# Patient Record
Sex: Male | Born: 1985 | Race: Black or African American | Hispanic: No | Marital: Single | Smoking: Never smoker
Health system: Southern US, Community
[De-identification: ages and names within clinical notes are randomized; demographics above are authoritative.]

---
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
## 2016-06-13 ENCOUNTER — Emergency Department (HOSPITAL_COMMUNITY)
Admission: EM | Admit: 2016-06-13 | Discharge: 2016-06-13 | Disposition: A | Discharge status: Home / Self Care | Payer: Self-pay | Attending: Emergency Medicine | Admitting: Emergency Medicine

## 2016-06-13 ENCOUNTER — Emergency Department (HOSPITAL_COMMUNITY): Payer: Self-pay

## 2016-06-13 ENCOUNTER — Encounter (HOSPITAL_COMMUNITY): Payer: Self-pay

## 2016-06-13 DIAGNOSIS — B349 Viral infection, unspecified: Secondary | ICD-10-CM | POA: Insufficient documentation

## 2016-06-13 LAB — URINALYSIS, ROUTINE W REFLEX MICROSCOPIC
Glucose, UA: NEGATIVE mg/dL
Ketones, ur: 40 mg/dL — AB
LEUKOCYTES UA: NEGATIVE
NITRITE: NEGATIVE
Protein, ur: 30 mg/dL — AB
SPECIFIC GRAVITY, URINE: 1.041 — AB (ref 1.005–1.030)
pH: 6 (ref 5.0–8.0)

## 2016-06-13 LAB — COMPREHENSIVE METABOLIC PANEL
ALK PHOS: 61 U/L (ref 38–126)
ALT: 18 U/L (ref 17–63)
ANION GAP: 8 (ref 5–15)
AST: 24 U/L (ref 15–41)
Albumin: 3.8 g/dL (ref 3.5–5.0)
BILIRUBIN TOTAL: 0.7 mg/dL (ref 0.3–1.2)
BUN: 7 mg/dL (ref 6–20)
CALCIUM: 9.1 mg/dL (ref 8.9–10.3)
CO2: 25 mmol/L (ref 22–32)
Chloride: 103 mmol/L (ref 101–111)
Creatinine, Ser: 1.1 mg/dL (ref 0.61–1.24)
Glucose, Bld: 104 mg/dL — ABNORMAL HIGH (ref 65–99)
POTASSIUM: 4.2 mmol/L (ref 3.5–5.1)
Sodium: 136 mmol/L (ref 135–145)
TOTAL PROTEIN: 7.9 g/dL (ref 6.5–8.1)

## 2016-06-13 LAB — CBC
HCT: 43.7 % (ref 39.0–52.0)
HEMOGLOBIN: 14.5 g/dL (ref 13.0–17.0)
MCH: 29 pg (ref 26.0–34.0)
MCHC: 33.2 g/dL (ref 30.0–36.0)
MCV: 87.4 fL (ref 78.0–100.0)
Platelets: 190 10*3/uL (ref 150–400)
RBC: 5 MIL/uL (ref 4.22–5.81)
RDW: 12.8 % (ref 11.5–15.5)
WBC: 7.2 10*3/uL (ref 4.0–10.5)

## 2016-06-13 LAB — URINE MICROSCOPIC-ADD ON

## 2016-06-13 LAB — LIPASE, BLOOD: Lipase: 28 U/L (ref 11–51)

## 2016-06-13 MED ORDER — ONDANSETRON 4 MG PO TBDP: 4.0000 mg | ORAL_TABLET | Freq: Once | ORAL | Status: DC

## 2016-06-13 MED ORDER — ONDANSETRON 4 MG PO TBDP: 4.0000 mg | ORAL_TABLET | Freq: Three times a day (TID) | ORAL | 0 refills | Status: DC | PRN

## 2016-06-13 MED ORDER — ACETAMINOPHEN 325 MG PO TABS
650.0000 mg | ORAL_TABLET | Freq: Once | ORAL | Status: AC | PRN
  Administered 2016-06-13: 650 mg via ORAL

## 2016-06-13 MED ORDER — ACETAMINOPHEN 325 MG PO TABS
ORAL_TABLET | ORAL | Status: AC
  Filled 2016-06-13: qty 2

## 2016-06-13 MED ORDER — BENZONATATE 100 MG PO CAPS: 100.0000 mg | ORAL_CAPSULE | Freq: Three times a day (TID) | ORAL | 0 refills | Status: DC | PRN

## 2016-06-13 NOTE — Discharge Instructions (Signed)
The results of your blood work and chest x-ray show no evidence or infection or pneumonia, and your fever, chills, and vomiting are likely viral. Please take OTC Tylenol as needed for relief of fevers and muscle aches, the prescribed Zofran for relief of nausea and vomiting, and Tessalon for your cough. You did have hemoglobin and bilirubin in your urine, and need to establish care with a primary care provider for reevaluation, so please call [facility] to establish care with a PCP.

## 2016-06-13 NOTE — ED Provider Notes (Signed)
[facility] Provider Note   CSN: [id number] Arrival date & time: 06/13/16  1442     History   Chief Complaint Chief Complaint  Patient presents with  . Chills  . Emesis  . Headache    HPI Paul Orr is a 30 y.o. male.  Patient is 30 yo M with no significant PMH, presenting with chief complaint of flu-like symptoms x 2 days. Reports fevers, chills, and muscle aches starting 2 days ago, and had two episodes of vomiting today. States he has been tolerating fluids and trying to drink water. He has a mild headache, but denies neck pain, hematemesis, abdominal pain, diarrhea, change in bowel habits, blood in stools, or dysuria. Also reports nonproductive cough, but no chest pain, shortness of breath, or wheezing. He tried Tamiflu, Robitussin, and Alka Seltzer without relief of symptoms. No other sick contacts at home.      History reviewed. No pertinent past medical history.  There are no active problems to display for this patient.   History reviewed. No pertinent surgical history.     Home Medications    Prior to Admission medications   Medication Sig Start Date End Date Taking? Authorizing Provider  guaiFENesin (ROBITUSSIN) 100 MG/5ML SOLN Take 15 mLs by mouth every 4 (four) hours as needed for cough or to loosen phlegm.   Yes Historical Provider, MD    Family History No family history on file.  Social History Social History  Substance Use Topics  . Smoking status: Never Smoker  . Smokeless tobacco: Never Used  . Alcohol use No     Allergies   Review of patient's allergies indicates no known allergies.   Review of Systems Review of Systems  Constitutional: Positive for chills and fever.  HENT: Negative for ear pain and sore throat.   Eyes: Negative for pain and visual disturbance.  Respiratory: Positive for cough. Negative for chest tightness, shortness of breath and wheezing.   Cardiovascular: Negative for chest pain, palpitations and leg  swelling.  Gastrointestinal: Positive for vomiting. Negative for abdominal pain, blood in stool, constipation and diarrhea.  Genitourinary: Negative for dysuria, flank pain and hematuria.  Musculoskeletal: Negative for back pain, neck pain and neck stiffness.  Skin: Negative for color change and rash.  Neurological: Positive for headaches. Negative for dizziness, seizures, syncope, weakness and numbness.     Physical Exam Updated Vital Signs BP 140/87   Pulse 82   Temp 98.1 F (36.7 C) (Oral)   Resp 16   Ht 5\' 10"  (1.778 m)   Wt 102.1 kg   SpO2 100%   BMI 32.28 kg/m   Physical Exam  Constitutional: He appears well-developed and well-nourished. No distress.  HENT:  Head: Normocephalic and atraumatic.  Mouth/Throat: Oropharynx is clear and moist.  TMs and ear canals normal bilaterally. No nasal mucosal edema or erythema. No frontal or maxillary sinus tenderness. No oropharyngeal exudate, erythema, or edema.  Eyes: Conjunctivae are normal.  Neck: Normal range of motion. Neck supple.  No nuchal rigidity or meningeal signs.  Cardiovascular: Normal rate, regular rhythm, normal heart sounds and intact distal pulses.   Pulmonary/Chest: Effort normal and breath sounds normal. No respiratory distress. He has no wheezes. He has no rales. He exhibits no tenderness.  Abdominal: Soft. Bowel sounds are normal. He exhibits no distension. There is no tenderness. There is no guarding.  Negative Murphy's or McBurney's point tenderness.  Musculoskeletal: Normal range of motion. He exhibits no edema or tenderness.  Lymphadenopathy:  He has no cervical adenopathy.  Neurological: He is alert.  Skin: Skin is warm and dry.  Psychiatric: He has a normal mood and affect.  Nursing note and vitals reviewed.    ED Treatments / Results  Labs (all labs ordered are listed, but only abnormal results are displayed) Labs Reviewed  COMPREHENSIVE METABOLIC PANEL - Abnormal; Notable for the following:        Result Value   Glucose, Bld 104 (*)    All other components within normal limits  LIPASE, BLOOD  CBC  URINALYSIS, ROUTINE W REFLEX MICROSCOPIC (NOT [facility])    EKG  EKG Interpretation None       Radiology No results found.  Procedures Procedures (including critical care time)  Medications Ordered in ED Medications  acetaminophen (TYLENOL) tablet 650 mg (650 mg Oral Given 06/13/16 1518)    Initial Impression / Assessment and Plan / ED Course  I have reviewed the triage vital signs and the nursing notes.  Pertinent labs & imaging results that were available during my care of the patient were reviewed by me and considered in my medical decision making (see chart for details).  Clinical Course   Patient noted to have fever of 100.6, and given Tylenol. Temperature improved to 98.1. No neck pain or nuchal regidity concerning for meningitis. CXR shows no evidence of pneumonia or acute pathology. CBC, CMP, and lipase normal.  Urinalysis shows evidence of dehydration, but patient tolerating fluids PO. Also noted to have bilirubin and moderate Hgb in urine, but no dysuria or flank pain. Advised patient to f/u with PCP for repeat urinalysis. Patient's fever and vomiting likely viral syndrome. Evaluated by attending physician, Dr. Gwyneth SproutWhitney Plunkett, who agreed with assessment and plan. Instructed to take OTC Tylenol for fever, Tessalon for cough, and Zofran for nausea and vomiting. Advised patient to [facility] to f/u with PCP, and return precautions to ED discussed.  Final Clinical Impressions(s) / ED Diagnoses   Final diagnoses:  Viral syndrome    New Prescriptions Discharge Medication List as of 06/13/2016  8:16 PM    START taking these medications   Details  benzonatate (TESSALON) 100 MG capsule Take 1 capsule (100 mg total) by mouth 3 (three) times daily as needed for cough., Starting Fri 06/13/2016, Print    ondansetron (ZOFRAN ODT) 4 MG  disintegrating tablet Take 1 tablet (4 mg total) by mouth every 8 (eight) hours as needed for nausea or vomiting., Starting Fri 06/13/2016, Print         Paul Orr F de [place] Branch[place] II, [place] 06/15/16 2030    Gwyneth SproutWhitney Plunkett, MD 06/16/16 [phone]

## 2016-06-13 NOTE — ED Notes (Signed)
Called Xray to let them know pt is waiting on scan. 6-7 pt's ahead of pt at this time

## 2016-06-13 NOTE — ED Triage Notes (Signed)
Per pT, Pt started to have chills, headache, and vomiting that started two days ago. Pt took Theraflu with no relief. Reports becoming hot with blankets and cold immediately after removing them.

## 2016-06-13 NOTE — ED Notes (Signed)
Patient transported to X-ray 

## 2016-06-14 ENCOUNTER — Encounter (HOSPITAL_COMMUNITY): Payer: Self-pay | Admitting: *Deleted

## 2016-06-14 ENCOUNTER — Emergency Department (HOSPITAL_COMMUNITY)
Admission: EM | Admit: 2016-06-14 | Discharge: 2016-06-14 | Disposition: A | Discharge status: Home / Self Care | Payer: Self-pay | Attending: Emergency Medicine | Admitting: Emergency Medicine

## 2016-06-14 DIAGNOSIS — R319 Hematuria, unspecified: Secondary | ICD-10-CM | POA: Insufficient documentation

## 2016-06-14 DIAGNOSIS — R197 Diarrhea, unspecified: Secondary | ICD-10-CM | POA: Insufficient documentation

## 2016-06-14 DIAGNOSIS — E860 Dehydration: Secondary | ICD-10-CM | POA: Insufficient documentation

## 2016-06-14 DIAGNOSIS — R112 Nausea with vomiting, unspecified: Secondary | ICD-10-CM | POA: Insufficient documentation

## 2016-06-14 DIAGNOSIS — R6889 Other general symptoms and signs: Secondary | ICD-10-CM

## 2016-06-14 LAB — URINALYSIS, ROUTINE W REFLEX MICROSCOPIC
Glucose, UA: NEGATIVE mg/dL
KETONES UR: NEGATIVE mg/dL
NITRITE: NEGATIVE
PH: 6 (ref 5.0–8.0)
Protein, ur: 30 mg/dL — AB
Specific Gravity, Urine: 1.03 (ref 1.005–1.030)

## 2016-06-14 LAB — I-STAT CHEM 8, ED
BUN: 10 mg/dL (ref 6–20)
CREATININE: 1.2 mg/dL (ref 0.61–1.24)
Calcium, Ion: 1.17 mmol/L (ref 1.15–1.40)
Chloride: 101 mmol/L (ref 101–111)
Glucose, Bld: 106 mg/dL — ABNORMAL HIGH (ref 65–99)
HEMATOCRIT: 48 % (ref 39.0–52.0)
HEMOGLOBIN: 16.3 g/dL (ref 13.0–17.0)
POTASSIUM: 3.9 mmol/L (ref 3.5–5.1)
Sodium: 140 mmol/L (ref 135–145)
TCO2: 28 mmol/L (ref 0–100)

## 2016-06-14 LAB — URINE MICROSCOPIC-ADD ON

## 2016-06-14 MED ORDER — SODIUM CHLORIDE 0.9 % IV SOLN
INTRAVENOUS | Status: DC
  Administered 2016-06-14: 22:00:00 via INTRAVENOUS

## 2016-06-14 MED ORDER — HYDROCOD POLST-CPM POLST ER 10-8 MG/5ML PO SUER
5.0000 mL | Freq: Once | ORAL | Status: AC
  Administered 2016-06-14: 5 mL via ORAL
  Filled 2016-06-14: qty 5

## 2016-06-14 MED ORDER — ONDANSETRON 4 MG PO TBDP
4.0000 mg | ORAL_TABLET | Freq: Once | ORAL | Status: AC
  Administered 2016-06-14: 4 mg via ORAL
  Filled 2016-06-14: qty 1

## 2016-06-14 MED ORDER — ONDANSETRON HCL 4 MG PO TABS: 4.0000 mg | ORAL_TABLET | Freq: Four times a day (QID) | ORAL | 0 refills | Status: DC

## 2016-06-14 MED ORDER — PROMETHAZINE-PHENYLEPHRINE 6.25-5 MG/5ML PO SYRP: 5.0000 mL | ORAL_SOLUTION | Freq: Four times a day (QID) | ORAL | 0 refills | Status: DC | PRN

## 2016-06-14 NOTE — ED Triage Notes (Signed)
Patient presents with c/o feeling bad.  Seen here yesterday for the same and dx with viral syndrome.  Did not get prescriptions filled

## 2016-06-14 NOTE — ED Provider Notes (Signed)
[facility] Provider Note   CSN: [id number] Arrival date & time: 06/14/16  2005  By signing my name below, I, Rosario Adie, attest that this documentation has been prepared under the direction and in the presence of [facility], [place].  Electronically Signed: Rosario Adie, ED Scribe. 06/14/16. 9:04 PM.  History   Chief Complaint Chief Complaint  Patient presents with  . Flu like symptoms   The history is provided by the patient and medical records. No language interpreter was used.  Illness  This is a new problem. The current episode started more than 2 days ago. The problem occurs constantly. The problem has been gradually worsening. Associated symptoms include shortness of breath. Pertinent negatives include no chest pain, no abdominal pain and no headaches. Nothing aggravates the symptoms. Nothing relieves the symptoms. Treatments tried: Therflu, Robitussin, and Alka Seltzer. The treatment provided no relief.   HPI Comments: Paul Orr is a 30 y.o. male with no other PMHx, who presents to the Emergency Department complaining of moderate generalized malaise and bodily weakness onset approximately 3 days ago. Associated symptoms include a subjective fever, chills, diffuse myalgias, nausea, vomiting, diarrhea, mild SOB, and a productive cough w/ yellow sputum since the onset of his malaise. Pt was seen yesterday in the ED for same, and at that time a CXR was performed which was overall unremarkable. Pt was d/c at that time w/ a dx of likely viral etiology and rx'd Tessalon and Zofran which he states he has not filled nor taken yet. However, pt notes that he has been taking Theraflu, Robitussin, and Alka Seltzer w/ minimal relief of his current symptoms. No sick contacts w/ similar symptoms. Pt has had decreased PO intake since the onset of his symptoms secondary to exacerbating his nausea/vomiting. He denies chest pain, neck pain, headache, abdominal pain, constipation,  or any other associated symptoms.   History reviewed. No pertinent past medical history.  There are no active problems to display for this patient.  History reviewed. No pertinent surgical history.  Home Medications    Prior to Admission medications   Medication Sig Start Date End Date Taking? Authorizing Provider  benzonatate (TESSALON) 100 MG capsule Take 1 capsule (100 mg total) by mouth 3 (three) times daily as needed for cough. 06/13/16   Daryl F de Villier II, PA  guaiFENesin (ROBITUSSIN) 100 MG/5ML SOLN Take 15 mLs by mouth every 4 (four) hours as needed for cough or to loosen phlegm.    Historical Provider, MD  ondansetron (ZOFRAN ODT) 4 MG disintegrating tablet Take 1 tablet (4 mg total) by mouth every 8 (eight) hours as needed for nausea or vomiting. 06/13/16   Daryl F de Villier II, PA  ondansetron (ZOFRAN) 4 MG tablet Take 1 tablet (4 mg total) by mouth every 6 (six) hours. 06/14/16   Johnye Kist Orlene Och, NP  promethazine-phenylephrine (PROMETHAZINE VC) 6.25-5 MG/5ML SYRP Take 5 mLs by mouth every 6 (six) hours as needed for congestion. 06/14/16   Bow Buntyn Orlene Och, NP   Family History No family history on file.  Social History Social History  Substance Use Topics  . Smoking status: Never Smoker  . Smokeless tobacco: Never Used  . Alcohol use No   Allergies   Review of patient's allergies indicates no known allergies.  Review of Systems Review of Systems  Constitutional: Positive for chills and fever (subjective).  Respiratory: Positive for cough and shortness of breath.   Cardiovascular: Negative for chest pain.  Gastrointestinal: Positive  for diarrhea, nausea and vomiting. Negative for abdominal pain and constipation.  Musculoskeletal: Positive for myalgias (diffuse). Negative for neck pain.  Neurological: Positive for weakness (diffuse). Negative for headaches.  All other systems reviewed and are negative.  Physical Exam Updated Vital Signs BP 130/96 (BP Location: Right  Arm)   Pulse 90   Temp 99.6 F (37.6 C) (Oral)   Resp 20   Wt 102.1 kg   SpO2 97%   BMI 32.28 kg/m   Physical Exam  Constitutional: He appears well-developed and well-nourished.  HENT:  Head: Normocephalic.  Mouth/Throat: Uvula is midline and mucous membranes are normal. No posterior oropharyngeal erythema.  Eyes: Conjunctivae are normal.  Neck: Normal range of motion. Neck supple.  Cardiovascular: Normal rate and regular rhythm.   Pulmonary/Chest: Effort normal. No respiratory distress. He has no wheezes. He has no rales.  Abdominal: Soft. He exhibits no distension. There is no tenderness.  Musculoskeletal: Normal range of motion.  Lymphadenopathy:    He has no cervical adenopathy.  Neurological: He is alert.  Skin: Skin is warm and dry.  Psychiatric: He has a normal mood and affect. His behavior is normal.  Nursing note and vitals reviewed.  ED Treatments / Results  DIAGNOSTIC STUDIES: Oxygen Saturation is 97% on RA, normal by my interpretation.   COORDINATION OF CARE: 9:04 PM-Discussed next steps with pt including IV fluids and repeat CBC/UA. Pt verbalized understanding and is agreeable with the plan.   Labs (all labs ordered are listed, but only abnormal results are displayed) Labs Reviewed  URINALYSIS, ROUTINE W REFLEX MICROSCOPIC (NOT AT [facility]) - Abnormal; Notable for the following:       Result Value   Color, Urine AMBER (*)    APPearance TURBID (*)    Hgb urine dipstick MODERATE (*)    Bilirubin Urine SMALL (*)    Protein, ur 30 (*)    Leukocytes, UA TRACE (*)    All other components within normal limits  URINE MICROSCOPIC-ADD ON - Abnormal; Notable for the following:    Squamous Epithelial / LPF 0-5 (*)    Bacteria, UA FEW (*)    All other components within normal limits  I-STAT CHEM 8, ED - Abnormal; Notable for the following:    Glucose, Bld 106 (*)    All other components within normal limits   Radiology Dg Chest 2 View  Result Date:  06/13/2016 CLINICAL DATA:  Chills and vomiting 2 days ago EXAM: CHEST  2 VIEW COMPARISON:  Report from 09/23/2002 FINDINGS: The heart size and mediastinal contours are within normal limits. Both lungs are clear. The visualized skeletal structures are unremarkable. IMPRESSION: No active cardiopulmonary disease. Electronically Signed   By: Tollie Ethavid  Kwon M.D.   On: 06/13/2016 19:41   Procedures Procedures   Medications Ordered in ED Medications  ondansetron (ZOFRAN-ODT) disintegrating tablet 4 mg (4 mg Oral Given 06/14/16 2140)  chlorpheniramine-HYDROcodone (TUSSIONEX) 10-8 MG/5ML suspension 5 mL (5 mLs Oral Given 06/14/16 2218)   Patient states he feels much better after IV hydration and medication for nausea. He is able to take PO fluids and reports he is ready to go home and go to bed.   Initial Impression / Assessment and Plan / ED Course  I have reviewed the triage vital signs and the nursing notes.  Pertinent labs & imaging from tonight and yesterday results that were available during my care of the patient were reviewed by me and considered in my medical decision making (see chart for details).  Clinical Course   Patient with  Flu like symptoms.Vitals are stable, low-grade fever. Due to pt being unable to tolerate PO well and early signs of dehydration will give IV fluids while in the ED and reassess. Lungs are clear. CXR was ordered when pt was evaluated in the ED one day ago which was unremarkable. Symptoms were managed while in the ED. Patient will be discharged with instructions to orally hydrate, rest, and use over-the-counter medications such as anti-inflammatories ibuprofen and Aleve for muscle aches and Tylenol for fever. Will Rx Zofran for n/v and diet for diarrhea. Pt is comfortable with above plan and is stable for discharge at this time. All questions were answered prior to disposition. Strict return precautions for f/u into the ED were discussed.   Final Clinical Impressions(s) /  ED Diagnoses   Final diagnoses:  Flu-like symptoms  Hematuria, unspecified type  Nausea vomiting and diarrhea   New Prescriptions Discharge Medication List as of 06/14/2016 10:59 PM    START taking these medications   Details  ondansetron (ZOFRAN) 4 MG tablet Take 1 tablet (4 mg total) by mouth every 6 (six) hours., Starting Sat 06/14/2016, Print    promethazine-phenylephrine (PROMETHAZINE VC) 6.25-5 MG/5ML SYRP Take 5 mLs by mouth every 6 (six) hours as needed for congestion., Starting Sat 06/14/2016, Print       I personally performed the services described in this documentation, which was scribed in my presence. The recorded information has been reviewed and is accurate.     Hamilton[place] M Lashya Passe, [place] 06/15/16 1540    Nelva Nayobert Beaton, MD 06/15/16 2027

## 2016-07-16 ENCOUNTER — Encounter (HOSPITAL_COMMUNITY): Payer: Self-pay

## 2016-07-16 ENCOUNTER — Emergency Department (HOSPITAL_COMMUNITY)
Admission: EM | Admit: 2016-07-16 | Discharge: 2016-07-16 | Disposition: A | Discharge status: Home / Self Care | Payer: Self-pay | Attending: Emergency Medicine | Admitting: Emergency Medicine

## 2016-07-16 DIAGNOSIS — L243 Irritant contact dermatitis due to cosmetics: Secondary | ICD-10-CM | POA: Insufficient documentation

## 2016-07-16 MED ORDER — CEPHALEXIN 500 MG PO CAPS: 500.0000 mg | ORAL_CAPSULE | Freq: Two times a day (BID) | ORAL | 0 refills | Status: DC

## 2016-07-16 MED ORDER — BETAMETHASONE DIPROPIONATE 0.05 % EX OINT: TOPICAL_OINTMENT | Freq: Two times a day (BID) | CUTANEOUS | 0 refills | Status: DC

## 2016-07-16 NOTE — Discharge Instructions (Signed)
Please read and follow all provided instructions.  Your diagnoses today include:  1. Irritant contact dermatitis due to cosmetics    Tests performed today include: Vital signs. See below for your results today.   Medications prescribed:  Take as prescribed   Take Antibiotic (Keflex) if rash remains, gets redder, or you notice lots of draining from reddened site. Follow up with PCP or ER after 2-3 days on antibiotic if this does not improve  Home care instructions:  Follow any educational materials contained in this packet.  Follow-up instructions: Please follow-up with your primary care provider for further evaluation of symptoms and treatment   Return instructions:  Please return to the Emergency Department if you do not get better, if you get worse, or new symptoms OR  - Fever (temperature greater than 101.60F)  - Bleeding that does not stop with holding pressure to the area    -Severe pain (please note that you may be more sore the day after your accident)  - Chest Pain  - Difficulty breathing  - Severe nausea or vomiting  - Inability to tolerate food and liquids  - Passing out  - Skin becoming red around your wounds  - Change in mental status (confusion or lethargy)  - New numbness or weakness    Please return if you have any other emergent concerns.  Additional Information:  Your vital signs today were: BP 137/70    Pulse 79    Temp 97.8 F (36.6 C) (Oral)    Resp 18    SpO2 99%  If your blood pressure (BP) was elevated above 135/85 this visit, please have this repeated by your doctor within one month. ---------------

## 2016-07-16 NOTE — ED Notes (Signed)
Pt comfortable with discharge and follow up instructions. Pt declines wheelchair, escorted to waiting area by this RN. Rx x2 

## 2016-07-16 NOTE — ED Triage Notes (Signed)
Patient here with itchy dry rash to arms and sided for a couple of weeks. Rash was improved until he used scented lotion. Mild redness with same

## 2016-07-16 NOTE — ED Provider Notes (Signed)
[facility] Provider Note   CSN: [id number] Arrival date & time: 07/16/16  [id number]  By signing my name below, I, Paul Orr, attest that this documentation has been prepared under the direction and in the presence of Audry Piliyler Weber Monnier, PA-C . Electronically Signed: Sonum Orr, Neurosurgeoncribe. 07/16/16. 10:06 AM.  History   Chief Complaint No chief complaint on file.   The history is provided by the patient. No language interpreter was used.     HPI Comments: Paul Orr is a 30 y.o. male with past medical history of eczema who presents to the Emergency Department complaining of a persistent, pruritic rash to the bilateral upper extremities and waist area that began 1 week ago. He reports having dry skin and applied a scented lotion after which this rash began. He has applied A&D ointment and Benadryl with some relief. He denies new/change in soaps or detergents. He denies fever.   History reviewed. No pertinent past medical history.  There are no active problems to display for this patient.   History reviewed. No pertinent surgical history.    Home Medications    Prior to Admission medications   Medication Sig Start Date End Date Taking? Authorizing Provider  benzonatate (TESSALON) 100 MG capsule Take 1 capsule (100 mg total) by mouth 3 (three) times daily as needed for cough. 06/13/16   Daryl F de Villier II, PA  guaiFENesin (ROBITUSSIN) 100 MG/5ML SOLN Take 15 mLs by mouth every 4 (four) hours as needed for cough or to loosen phlegm.    Historical Provider, MD  ondansetron (ZOFRAN ODT) 4 MG disintegrating tablet Take 1 tablet (4 mg total) by mouth every 8 (eight) hours as needed for nausea or vomiting. 06/13/16   Daryl F de Villier II, PA  ondansetron (ZOFRAN) 4 MG tablet Take 1 tablet (4 mg total) by mouth every 6 (six) hours. 06/14/16   Hope Orlene OchM Neese, NP  promethazine-phenylephrine (PROMETHAZINE VC) 6.25-5 MG/5ML SYRP Take 5 mLs by mouth every 6 (six) hours as needed for  congestion. 06/14/16   Hope Orlene OchM Neese, NP    Family History No family history on file.  Social History Social History  Substance Use Topics  . Smoking status: Never Smoker  . Smokeless tobacco: Never Used  . Alcohol use No     Allergies   Patient has no known allergies.   Review of Systems Review of Systems  Constitutional: Negative for fever.  Skin: Positive for rash.     Physical Exam Updated Vital Signs BP 137/70   Pulse 79   Temp 97.8 F (36.6 C) (Oral)   Resp 18   SpO2 99%   Physical Exam  Constitutional: He is oriented to person, place, and time. Vital signs are normal. He appears well-developed and well-nourished.  HENT:  Head: Normocephalic and atraumatic.  Right Ear: Hearing normal.  Left Ear: Hearing normal.  Eyes: Conjunctivae and EOM are normal. Pupils are equal, round, and reactive to light.  Neck: Normal range of motion. Neck supple.  Cardiovascular: Normal rate and regular rhythm.   Pulmonary/Chest: Effort normal.  Musculoskeletal: Normal range of motion.  Neurological: He is alert and oriented to person, place, and time.  Skin: Skin is warm and dry. Rash noted. Rash is papular. There is erythema.  Erythematous, papular, blanchable, non-purulent rash noted on bilateral upper arms as well as bilateral flanks.   Psychiatric: He has a normal mood and affect. His speech is normal and behavior is normal. Thought content normal.  Nursing note  and vitals reviewed.  ED Treatments / Results  DIAGNOSTIC STUDIES: Oxygen Saturation is 99% on RA, normal by my interpretation.    COORDINATION OF CARE: 10:06 AM Discussed treatment plan with pt at bedside and pt agreed to plan.    Labs (all labs ordered are listed, but only abnormal results are displayed) Labs Reviewed - No data to display  EKG  EKG Interpretation None      Radiology No results found.  Procedures Procedures (including critical care time)  Medications Ordered in ED Medications -  No data to display   Initial Impression / Assessment and Plan / ED Course  I have reviewed the triage vital signs and the nursing notes.  Pertinent labs & imaging results that were available during my care of the patient were reviewed by me and considered in my medical decision making (see chart for details).  Clinical Course    Final Clinical Impressions(s) / ED Diagnoses     I have reviewed the relevant previous healthcare records.  I obtained HPI from historian.   ED Course:  Assessment: Patient with contact dermatitis. Instructed to avoid offending agent and to use unscented soaps, lotions, and detergents. Will treat with steroid cream.  No signs of secondary infection. Follow up with PCP in 2-3 days. Return precautions discussed. Given Rx ABX due to mild glossing over LUE. No real purulence. No signs of infection. Counseled to use ABX and follow up with PCP. Given strict return precautions. Pt is safe for discharge at this time.  Disposition/Plan:  DC Home Additional Verbal discharge instructions given and discussed with patient.  Pt Instructed to f/u with PCP in the next week for evaluation and treatment of symptoms. Return precautions given Pt acknowledges and agrees with plan  Supervising Physician Alvira MondayErin Schlossman, MD  Final diagnoses:  Irritant contact dermatitis due to cosmetics    New Prescriptions New Prescriptions   BETAMETHASONE DIPROPIONATE (DIPROLENE) 0.05 % OINTMENT    Apply topically 2 (two) times daily. Until resolution   CEPHALEXIN (KEFLEX) 500 MG CAPSULE    Take 1 capsule (500 mg total) by mouth 2 (two) times daily.   I personally performed the services described in this documentation, which was scribed in my presence. The recorded information has been reviewed and is accurate.    Audry Piliyler Tyshell Ramberg, PA-C 07/16/16 1010    Alvira MondayErin Schlossman, MD 07/17/16 [phone]

## 2017-04-02 IMAGING — CR DG CHEST 2V
2 series · 2 of 2 positions shown · non-contrast
Comparison: Report from 09/23/2002

CLINICAL DATA: Chills and vomiting 2 days ago

EXAM:
CHEST  2 VIEW

[chest pa]
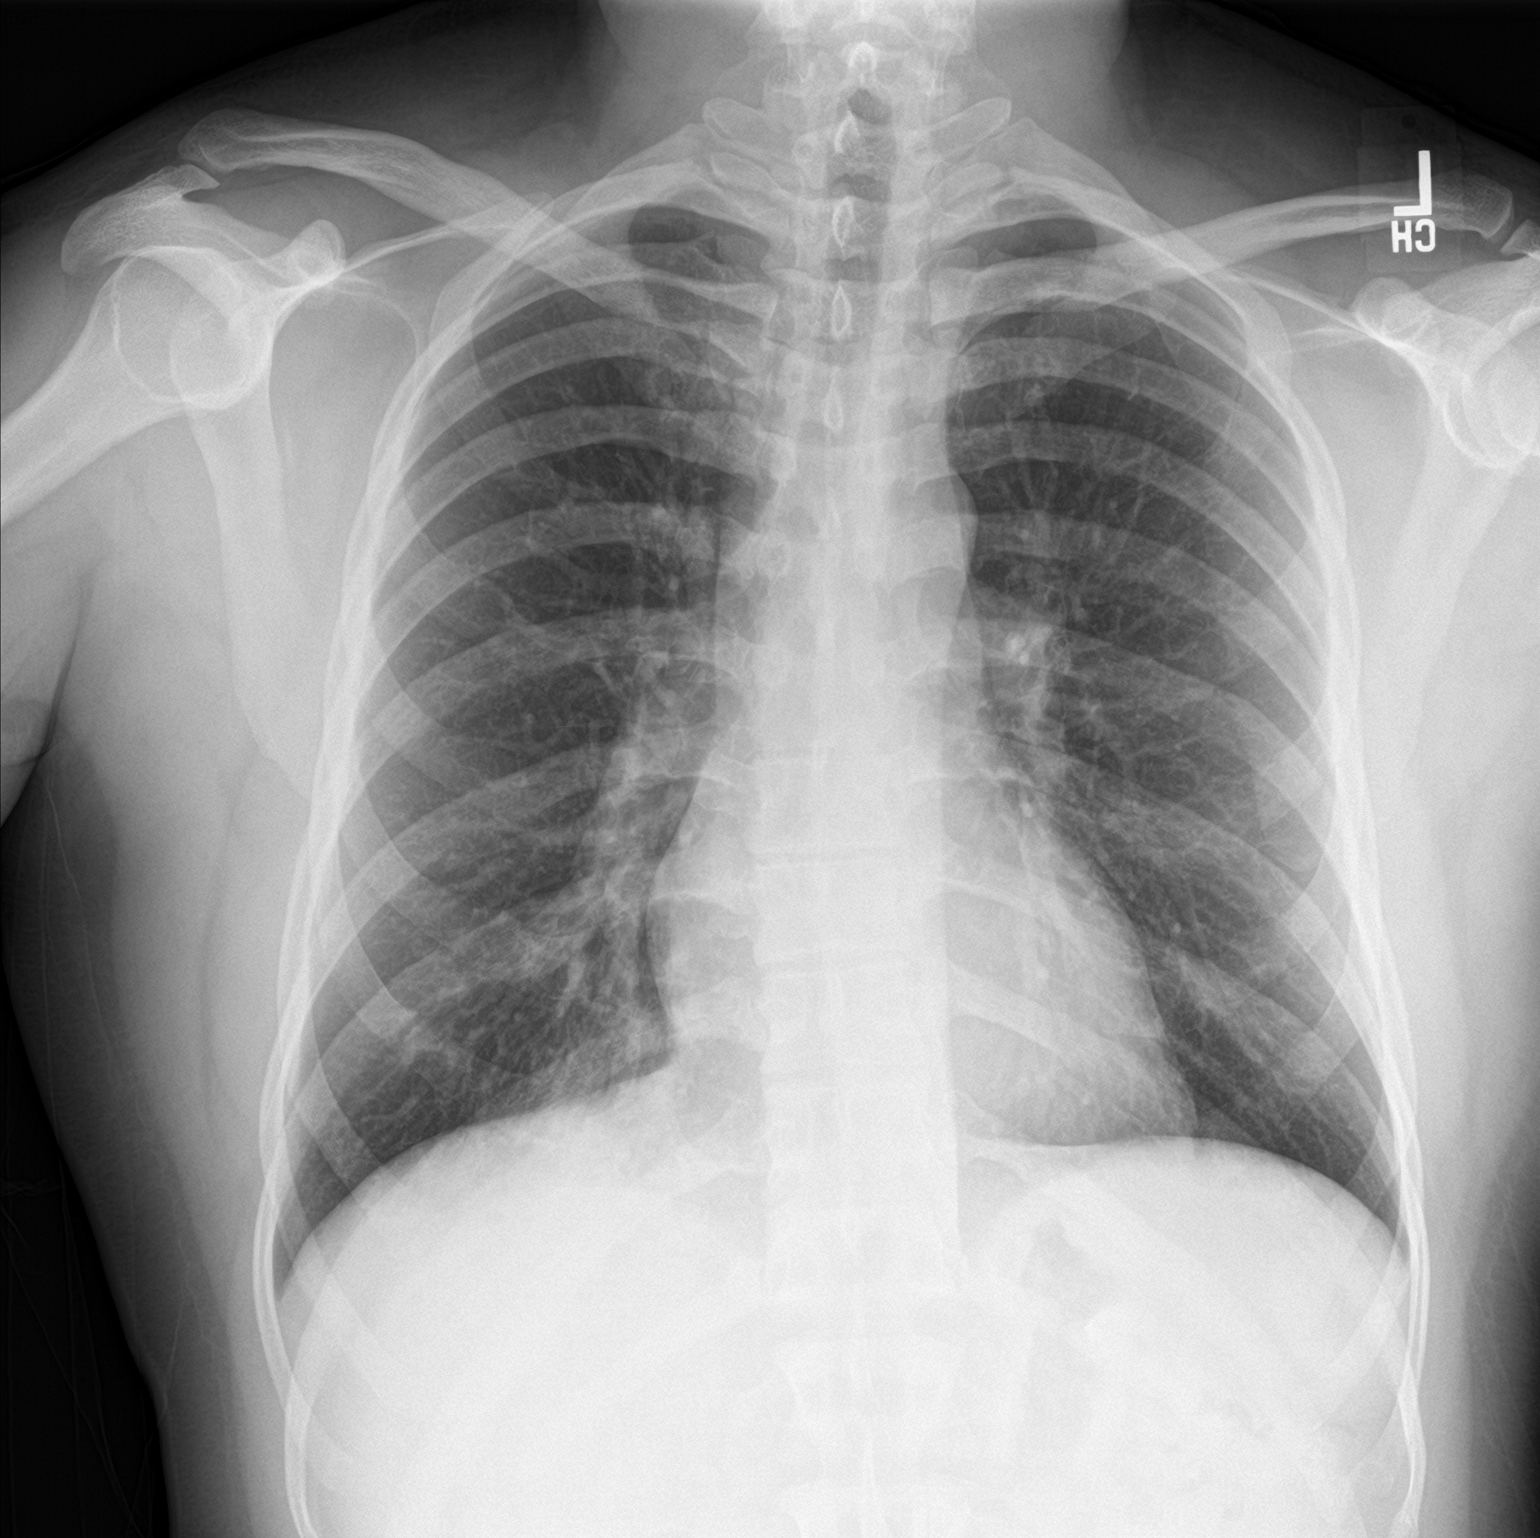

[chest lat]
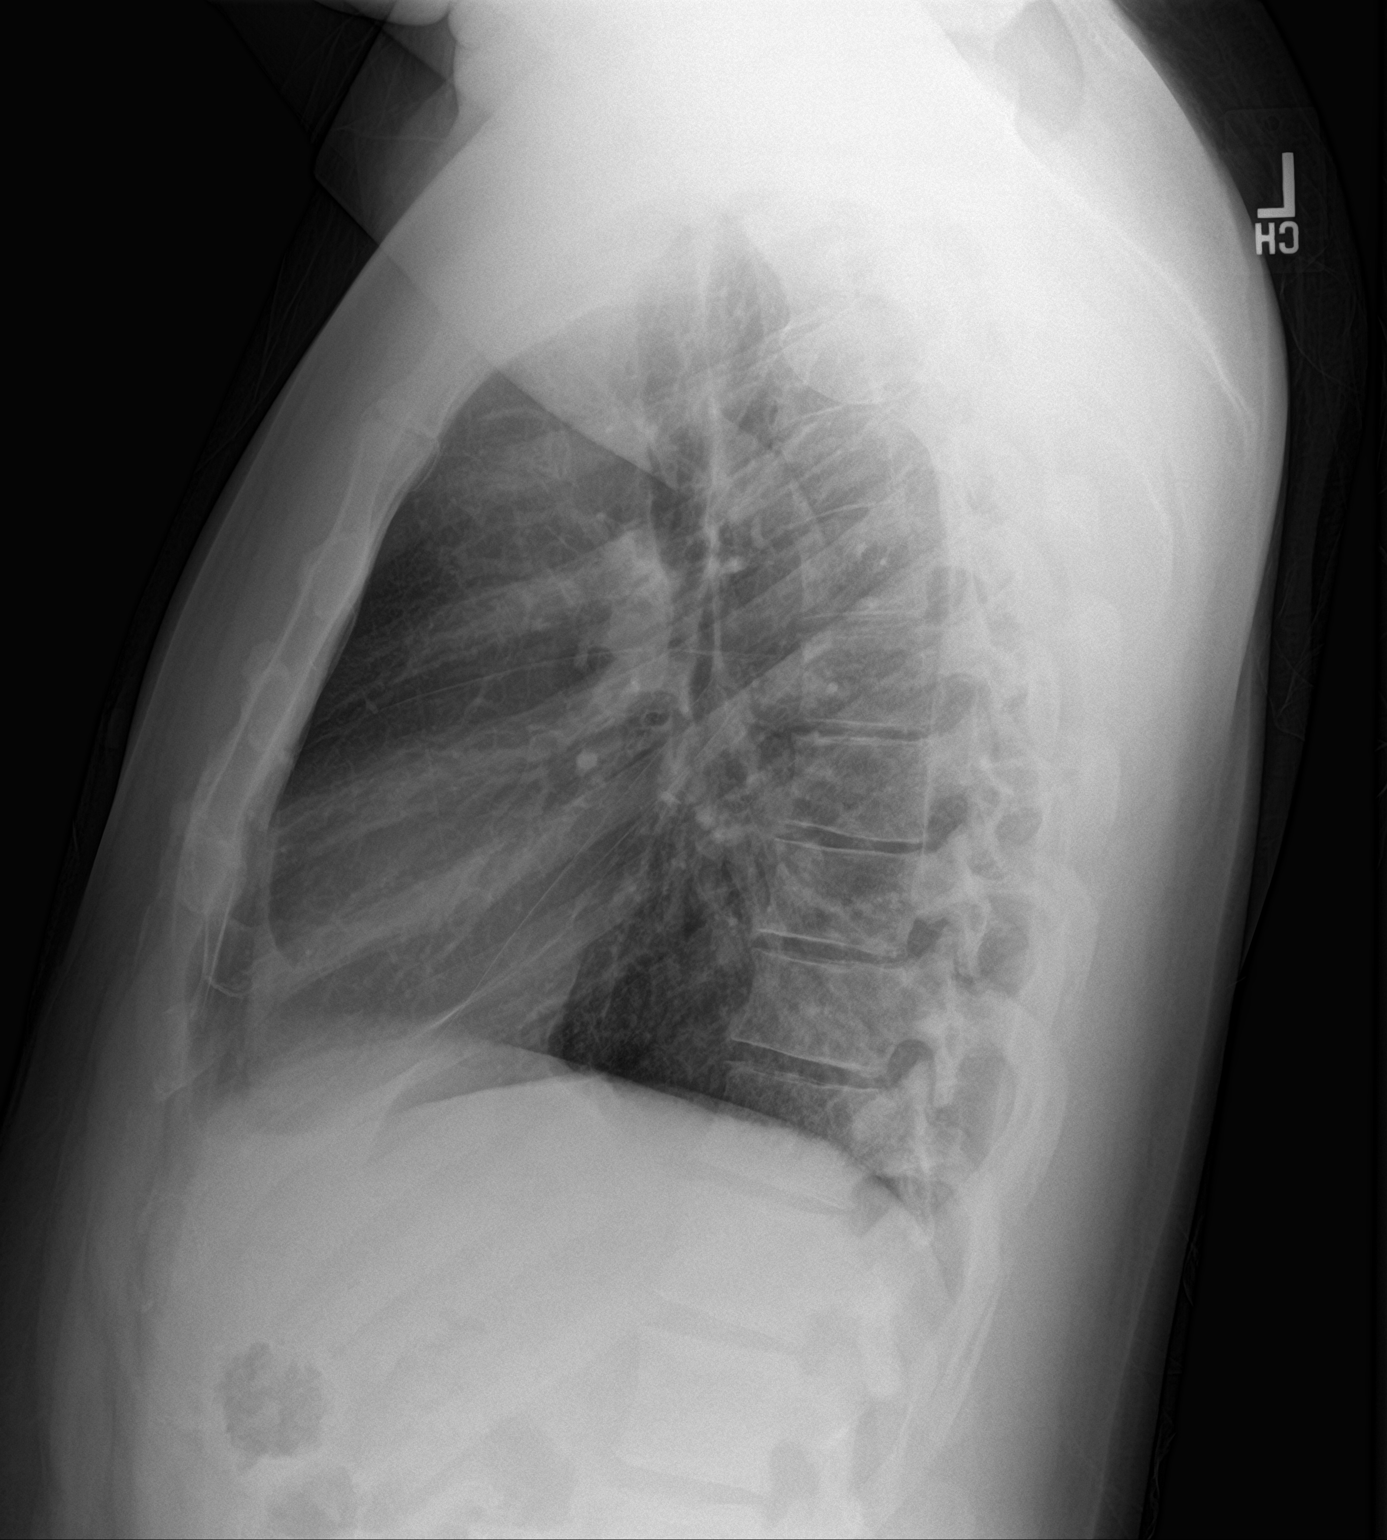

[2 of 2 positions shown; findings below may reference images not displayed]

FINDINGS: The heart size and mediastinal contours are within normal limits.
Both lungs are clear. The visualized skeletal structures are
unremarkable.
IMPRESSION: No active cardiopulmonary disease.

## 2023-07-31 ENCOUNTER — Other Ambulatory Visit: Payer: Self-pay | Admitting: Neurosurgery

## 2023-08-14 ENCOUNTER — Encounter: Payer: Self-pay | Admitting: Neurosurgery

## 2023-08-20 ENCOUNTER — Ambulatory Visit
Admission: RE | Admit: 2023-08-20 | Discharge: 2023-08-20 | Disposition: A | Discharge status: Home / Self Care | Payer: Medicaid Other | Source: Ambulatory Visit | Attending: Neurosurgery | Admitting: Neurosurgery

## 2023-08-20 MED ORDER — IOPAMIDOL (ISOVUE-370) INJECTION 76%
75.0000 mL | Freq: Once | INTRAVENOUS | Status: AC | PRN
  Administered 2023-08-20: 75 mL via INTRAVENOUS

## 2023-10-18 ENCOUNTER — Ambulatory Visit
Admission: RE | Admit: 2023-10-18 | Discharge: 2023-10-18 | Disposition: A | Discharge status: Home / Self Care | Payer: Self-pay | Source: Ambulatory Visit

## 2023-10-18 VITALS — BP 122/86 | HR 91 | Temp 98.3°F | Resp 17

## 2023-10-18 DIAGNOSIS — A084 Viral intestinal infection, unspecified: Secondary | ICD-10-CM

## 2023-10-18 MED ORDER — ONDANSETRON 4 MG PO TBDP: 4.0000 mg | ORAL_TABLET | Freq: Three times a day (TID) | ORAL | 0 refills | Status: DC | PRN

## 2023-10-18 MED ORDER — ONDANSETRON 4 MG PO TBDP
4.0000 mg | ORAL_TABLET | Freq: Once | ORAL | Status: AC
  Administered 2023-10-18: 4 mg via ORAL

## 2023-10-18 NOTE — ED Provider Notes (Signed)
 Paul Orr    CSN: [id number] Arrival date & time: 10/18/23  1337      History   Chief Complaint Chief Complaint  Patient presents with   Generalized Body Aches    Entered by patient    HPI Paul Orr is a 38 y.o. male.   Paul Orr is a 38 y.o. male presenting for chief complaint of nausea, vomiting, and diarrhea that started yesterday. He had one episode of non-bloody/non-bilious emesis last night and has had multiple episodes of diarrhea today. No blood/mucous to the diarrhea. Complains of generalized abdominal pain and body aches. Denies fevers, chills, dizziness, urinary symptoms, rash, headache, and history of abdominal surgeries.  Reports recent sick contact with similar symptoms.  Denies viral URI symptoms and exposures to the flu.  He has not attempted treatment of symptoms PTA.      History reviewed. No pertinent past medical history.  There are no active problems to display for this patient.   History reviewed. No pertinent surgical history.     Home Medications    Prior to Admission medications   Medication Sig Start Date End Date Taking? Authorizing Provider  gabapentin (NEURONTIN) 300 MG capsule take 1 capsule by oral route 3 times every day; start 1hs, increase q5days to tid Patient not taking: Reported on 10/18/2023 10/14/23   [provider]  ondansetron (ZOFRAN-ODT) 4 MG disintegrating tablet Take 1 tablet (4 mg total) by mouth every 8 (eight) hours as needed for nausea or vomiting. 10/18/23  Yes Carlisle Beers, FNP  acetaminophen (TYLENOL) 325 MG tablet     [provider]  benzonatate (TESSALON) 100 MG capsule Take 1 capsule (100 mg total) by mouth 3 (three) times daily as needed for cough. Patient not taking: Reported on 10/18/2023 06/13/16   de Villier, Daryl F II, PA  betamethasone dipropionate (DIPROLENE) 0.05 % ointment Apply topically 2 (two) times daily. Until resolution Patient not taking: Reported on  10/18/2023 07/16/16   Audry Pili, PA-C  cephALEXin (KEFLEX) 500 MG capsule Take 1 capsule (500 mg total) by mouth 2 (two) times daily. Patient not taking: Reported on 10/18/2023 07/16/16   Audry Pili, PA-C  guaiFENesin (ROBITUSSIN) 100 MG/5ML SOLN Take 15 mLs by mouth every 4 (four) hours as needed for cough or to loosen phlegm. Patient not taking: Reported on 10/18/2023    [provider]  ondansetron (ZOFRAN) 4 MG tablet Take 1 tablet (4 mg total) by mouth every 6 (six) hours. Patient not taking: Reported on 10/18/2023 06/14/16   Janne Napoleon, NP  promethazine-phenylephrine (PROMETHAZINE VC) 6.25-5 MG/5ML SYRP Take 5 mLs by mouth every 6 (six) hours as needed for congestion. Patient not taking: Reported on 10/18/2023 06/14/16   Janne Napoleon, NP    Family History History reviewed. No pertinent family history.  Social History Social History   Tobacco Use   Smoking status: Never   Smokeless tobacco: Never  Substance Use Topics   Alcohol use: No   Drug use: Yes    Types: Marijuana     Allergies   Patient has no known allergies.   Review of Systems Review of Systems Per HPI  Physical Exam Triage Vital Signs ED Triage Vitals  Encounter Vitals Group     BP 10/18/23 1348 122/86     Systolic BP Percentile --      Diastolic BP Percentile --      Pulse Rate 10/18/23 1348 91     Resp 10/18/23 1348  17     Temp 10/18/23 1348 98.3 F (36.8 C)     Temp Source 10/18/23 1348 Oral     SpO2 10/18/23 1348 97 %     Weight --      Height --      Head Circumference --      Peak Flow --      Pain Score 10/18/23 1351 0     Pain Loc --      Pain Education --      Exclude from Growth Chart --    No data found.  Updated Vital Signs BP 122/86 (BP Location: Right Arm)   Pulse 91   Temp 98.3 F (36.8 C) (Oral)   Resp 17   SpO2 97%   Visual Acuity Right Eye Distance:   Left Eye Distance:   Bilateral Distance:    Right Eye Near:   Left Eye Near:    Bilateral Near:      Physical Exam Vitals and nursing note reviewed.  Constitutional:      Appearance: He is not ill-appearing or toxic-appearing.  HENT:     Head: Normocephalic and atraumatic.     Right Ear: Hearing and external ear normal.     Left Ear: Hearing and external ear normal.     Nose: Nose normal.     Mouth/Throat:     Lips: Pink.     Mouth: Mucous membranes are moist.  Eyes:     General: Lids are normal. Vision grossly intact. Gaze aligned appropriately.     Extraocular Movements: Extraocular movements intact.     Conjunctiva/sclera: Conjunctivae normal.  Pulmonary:     Effort: Pulmonary effort is normal.  Abdominal:     General: Bowel sounds are normal.     Palpations: Abdomen is soft.     Tenderness: There is no abdominal tenderness. There is no right CVA tenderness, left CVA tenderness or guarding.  Musculoskeletal:     Cervical back: Neck supple.  Skin:    General: Skin is warm and dry.     Capillary Refill: Capillary refill takes less than 2 seconds.     Findings: No rash.  Neurological:     General: No focal deficit present.     Mental Status: He is alert and oriented to person, place, and time. Mental status is at baseline.     Cranial Nerves: No dysarthria or facial asymmetry.  Psychiatric:        Mood and Affect: Mood normal.        Speech: Speech normal.        Behavior: Behavior normal.        Thought Content: Thought content normal.        Judgment: Judgment normal.      Orr Treatments / Results  Labs (all labs ordered are listed, but only abnormal results are displayed) Labs Reviewed - No data to display  EKG   Radiology No results found.  Procedures Procedures (including critical care time)  Medications Ordered in Orr Medications  ondansetron (ZOFRAN-ODT) disintegrating tablet 4 mg (has no administration in time range)    Initial Impression / Assessment and Plan / Orr Course  I have reviewed the triage vital signs and the nursing  notes.  Pertinent labs & imaging results that were available during my care of the patient were reviewed by me and considered in my medical decision making (see chart for details).   1. Viral gastroenteritis Evaluation suggests viral gastrointestinal illness etiology.  Patient nontoxic appearing with hemodynamically stable vital signs, abdominal exam without peritoneal signs/focal tenderness. Will manage this with antiemetic (Zofran) as needed, OTC medicines as needed for discomfort/pain, increased fluids, and rest. Zofran given in clinic.  Liquid/bland diet initially, then increase diet as tolerated.   Counseled patient on potential for adverse effects with medications prescribed/recommended today, strict ER and return-to-clinic precautions discussed, patient verbalized understanding.    Final Clinical Impressions(s) / Orr Diagnoses   Final diagnoses:  Viral gastroenteritis     Discharge Instructions      Your evaluation suggests that your symptoms are most likely due to viral stomach illness (gastroenteritis/"stomach bug") which will improve on its own with rest and fluids in the next few days.   Take zofran to help with nausea every 8 hours as needed. You may use over the counter medicines for aches and pains such as tylenol as needed.  Start sipping on liquids (broth, water, gatorade, etc). If you are able to keep liquids down without vomiting for 1-2 hours, you may eat bland foods like jello, pudding, applesauce, bananas, rice, and white toast. Once you can tolerate blands, you may return to normal diet.   Pedialyte or gatorolyte may help to prevent/fix dehydration due to vomiting and diarrhea.  Please follow up with your primary care provider for further management. Return if you experience worsening or uncontrolled pain, inability to tolerate fluids by mouth, difficulty breathing, fevers 100.48F or greater, recurrent vomiting, or any other concerning symptoms.    ED  Prescriptions     Medication Sig Dispense Auth. Provider   ondansetron (ZOFRAN-ODT) 4 MG disintegrating tablet Take 1 tablet (4 mg total) by mouth every 8 (eight) hours as needed for nausea or vomiting. 20 tablet Carlisle Beers, FNP      PDMP not reviewed this encounter.   Carlisle Beers, [place] 10/18/23 1424

## 2023-10-18 NOTE — Discharge Instructions (Signed)

## 2023-10-18 NOTE — ED Triage Notes (Signed)
 Pt c/o body aches, nausea, upset stomach, diarrhea since last night. He threw up last night

## 2024-01-25 ENCOUNTER — Encounter: Payer: Self-pay | Admitting: Adult Health

## 2024-01-25 NOTE — Progress Notes (Signed)
 This encounter was created in error - please disregard.

## 2024-03-15 ENCOUNTER — Encounter (HOSPITAL_COMMUNITY): Payer: Self-pay

## 2024-03-15 ENCOUNTER — Other Ambulatory Visit: Payer: Self-pay

## 2024-03-15 ENCOUNTER — Emergency Department (HOSPITAL_COMMUNITY)
Admission: EM | Admit: 2024-03-15 | Discharge: 2024-03-15 | Disposition: A | Discharge status: Against Medical Advice | Attending: Emergency Medicine | Admitting: Emergency Medicine

## 2024-03-15 DIAGNOSIS — Z5321 Procedure and treatment not carried out due to patient leaving prior to being seen by health care provider: Secondary | ICD-10-CM | POA: Insufficient documentation

## 2024-03-15 DIAGNOSIS — R519 Headache, unspecified: Secondary | ICD-10-CM | POA: Diagnosis not present

## 2024-03-15 DIAGNOSIS — M542 Cervicalgia: Secondary | ICD-10-CM | POA: Insufficient documentation
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
## 2024-03-15 NOTE — ED Notes (Signed)
 Pt states he is going to urgent care

## 2024-06-01 ENCOUNTER — Ambulatory Visit: Admitting: Family Medicine

## 2024-06-01 NOTE — Progress Notes (Deleted)
 Office Note 06/01/2024  CC: No chief complaint on file.   HPI:  Paul Orr is a 39 y.o. male who is here to establish care,*** Patient's most recent primary MD: None Old records were not available for review prior to or during today's visit.  ***   PMP AWARE reviewed today: most recent rx for tramadol was filled 05/24/2024, # 90, rx by Alm Clause, [place] .  A prescription for oxycodone 10 mg was filled 06/14/2023, #18, prescribed by [facility]onalds in [place], [place]. No red flags.  No past medical history on file.  No past surgical history on file.  No family history on file.  Social History   Socioeconomic History   Marital status: Single    Spouse name: Not on file   Number of children: Not on file   Years of education: Not on file   Highest education level: Not on file  Occupational History   Not on file  Tobacco Use   Smoking status: Never   Smokeless tobacco: Never  Substance and Sexual Activity   Alcohol use: No   Drug use: Yes    Types: Marijuana   Sexual activity: Not on file  Other Topics Concern   Not on file  Social History Narrative   Not on file   Social Drivers of Health   Financial Resource Strain: Not on file  Food Insecurity: Not on file  Transportation Needs: Not on file  Physical Activity: Not on file  Stress: Not on file  Social Connections: Not on file  Intimate Partner Violence: Not on file    Outpatient Encounter Medications as of 06/01/2024  Medication Sig   buprenorphine (BUTRANS) 10 MCG/HR PTWK Place 1 patch onto the skin once a week.   acetaminophen  (TYLENOL ) 325 MG tablet    gabapentin (NEURONTIN) 300 MG capsule take 1 capsule by oral route 3 times every day; start 1hs, increase q5days to tid (Patient not taking: Reported on 01/25/2024)   [DISCONTINUED] benzonatate  (TESSALON ) 100 MG capsule Take 1 capsule (100 mg total) by mouth 3 (three) times daily as needed for cough. (Patient not taking: Reported on  01/25/2024)   [DISCONTINUED] betamethasone  dipropionate (DIPROLENE ) 0.05 % ointment Apply topically 2 (two) times daily. Until resolution (Patient not taking: Reported on 01/25/2024)   [DISCONTINUED] cephALEXin  (KEFLEX ) 500 MG capsule Take 1 capsule (500 mg total) by mouth 2 (two) times daily. (Patient not taking: Reported on 01/25/2024)   [DISCONTINUED] guaiFENesin (ROBITUSSIN) 100 MG/5ML SOLN Take 15 mLs by mouth every 4 (four) hours as needed for cough or to loosen phlegm. (Patient not taking: Reported on 10/18/2023)   [DISCONTINUED] ondansetron  (ZOFRAN ) 4 MG tablet Take 1 tablet (4 mg total) by mouth every 6 (six) hours. (Patient not taking: Reported on 01/25/2024)   [DISCONTINUED] ondansetron  (ZOFRAN -ODT) 4 MG disintegrating tablet Take 1 tablet (4 mg total) by mouth every 8 (eight) hours as needed for nausea or vomiting.   [DISCONTINUED] promethazine -phenylephrine  (PROMETHAZINE  VC) 6.25-5 MG/5ML SYRP Take 5 mLs by mouth every 6 (six) hours as needed for congestion. (Patient not taking: Reported on 01/25/2024)   No facility-administered encounter medications on file as of 06/01/2024.    No Known Allergies  Review of Systems *** PE; There were no vitals taken for this visit.  Physical Exam  *** Pertinent labs:  none  ASSESSMENT AND PLAN:   No problem-specific Assessment & Plan notes found for this encounter.   An After Visit Summary was printed and given to the patient.  No follow-ups on file.  Signed:  Gerlene Hockey, MD           06/01/2024

## 2024-06-25 ENCOUNTER — Emergency Department (HOSPITAL_COMMUNITY)

## 2024-06-25 ENCOUNTER — Other Ambulatory Visit: Payer: Self-pay

## 2024-06-25 ENCOUNTER — Emergency Department (HOSPITAL_COMMUNITY)
Admission: EM | Admit: 2024-06-25 | Discharge: 2024-06-25 | Disposition: A | Discharge status: Home / Self Care | Attending: Emergency Medicine | Admitting: Emergency Medicine

## 2024-06-25 ENCOUNTER — Encounter (HOSPITAL_COMMUNITY): Payer: Self-pay | Admitting: *Deleted

## 2024-06-25 DIAGNOSIS — R519 Headache, unspecified: Secondary | ICD-10-CM | POA: Insufficient documentation

## 2024-06-25 DIAGNOSIS — G8929 Other chronic pain: Secondary | ICD-10-CM

## 2024-06-25 MED ORDER — OXYCODONE HCL 5 MG PO TABS: 5.0000 mg | ORAL_TABLET | Freq: Four times a day (QID) | ORAL | 0 refills | Status: AC | PRN

## 2024-06-25 MED ORDER — OXYCODONE-ACETAMINOPHEN 5-325 MG PO TABS
2.0000 | ORAL_TABLET | Freq: Once | ORAL | Status: AC
  Administered 2024-06-25: 2 via ORAL
  Filled 2024-06-25: qty 2
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
## 2024-06-25 NOTE — ED Triage Notes (Signed)
 Pain in his neck

## 2024-06-25 NOTE — ED Notes (Signed)
 Patient transported to CT

## 2024-06-25 NOTE — ED Notes (Signed)
 Pt transported to CT ?

## 2024-07-13 ENCOUNTER — Emergency Department (HOSPITAL_COMMUNITY)

## 2024-07-13 ENCOUNTER — Other Ambulatory Visit: Payer: Self-pay

## 2024-07-13 ENCOUNTER — Emergency Department (HOSPITAL_COMMUNITY)
Admission: EM | Admit: 2024-07-13 | Discharge: 2024-07-13 | Disposition: A | Discharge status: Home / Self Care | Attending: Emergency Medicine | Admitting: Emergency Medicine

## 2024-07-13 ENCOUNTER — Encounter (HOSPITAL_COMMUNITY): Payer: Self-pay | Admitting: Emergency Medicine

## 2024-07-13 DIAGNOSIS — R10A1 Flank pain, right side: Secondary | ICD-10-CM

## 2024-07-13 DIAGNOSIS — D649 Anemia, unspecified: Secondary | ICD-10-CM | POA: Insufficient documentation

## 2024-07-13 DIAGNOSIS — K921 Melena: Secondary | ICD-10-CM | POA: Diagnosis not present

## 2024-07-13 LAB — URINALYSIS, ROUTINE W REFLEX MICROSCOPIC
Bilirubin Urine: NEGATIVE
Glucose, UA: NEGATIVE mg/dL
Hgb urine dipstick: NEGATIVE
Ketones, ur: NEGATIVE mg/dL
Leukocytes,Ua: NEGATIVE
Nitrite: NEGATIVE
Protein, ur: NEGATIVE mg/dL
Specific Gravity, Urine: 1.024 (ref 1.005–1.030)
pH: 6 (ref 5.0–8.0)

## 2024-07-13 LAB — CBC
HCT: 40.2 % (ref 39.0–52.0)
Hemoglobin: 12.6 g/dL — ABNORMAL LOW (ref 13.0–17.0)
MCH: 28.8 pg (ref 26.0–34.0)
MCHC: 31.3 g/dL (ref 30.0–36.0)
MCV: 92 fL (ref 80.0–100.0)
Platelets: 216 K/uL (ref 150–400)
RBC: 4.37 MIL/uL (ref 4.22–5.81)
RDW: 12.9 % (ref 11.5–15.5)
WBC: 7.2 K/uL (ref 4.0–10.5)
nRBC: 0 % (ref 0.0–0.2)

## 2024-07-13 LAB — BASIC METABOLIC PANEL WITH GFR
Anion gap: 12 (ref 5–15)
BUN: 14 mg/dL (ref 6–20)
CO2: 25 mmol/L (ref 22–32)
Calcium: 8.7 mg/dL — ABNORMAL LOW (ref 8.9–10.3)
Chloride: 104 mmol/L (ref 98–111)
Creatinine, Ser: 1.14 mg/dL (ref 0.61–1.24)
GFR, Estimated: >60 mL/min (ref >60)
Glucose, Bld: 97 mg/dL (ref 70–99)
Potassium: 3.9 mmol/L (ref 3.5–5.1)
Sodium: 141 mmol/L (ref 135–145)

## 2024-07-13 MED ORDER — IOHEXOL 350 MG/ML SOLN
75.0000 mL | Freq: Once | INTRAVENOUS | Status: AC | PRN
  Administered 2024-07-13: 75 mL via INTRAVENOUS

## 2024-07-13 MED ORDER — HYDROCORTISONE ACETATE 25 MG RE SUPP: 25.0000 mg | Freq: Two times a day (BID) | RECTAL | 0 refills | Status: AC | PRN

## 2024-07-13 NOTE — ED Provider Notes (Signed)
 [place] [facility]MENT AT [facility] Provider Note   CSN: [id number] Arrival date & time: 07/13/24  1851     Patient presents with: Flank Pain   Paul Orr is a 38 y.o. male.   Patient with no previous abdominal surgical history presents to the emergency department for evaluation of right flank pain and hematochezia over the past 2 days.  Patient's pain is in the flank and lower lateral back area.  Patient is noted maroon stool.  He shows me a picture which shows maroon color on the tissue as well as some maroon coloration to the water in the toilet.  He states that he may have had some blood in the urine in the past, but never in the stool.  He denies heavy NSAID or alcohol use.  He denies current hematuria or other bruising or bleeding.  No vomiting.       Prior to Admission medications   Medication Sig Start Date End Date Taking? Authorizing Provider  acetaminophen  (TYLENOL ) 325 MG tablet     [provider]  buprenorphine (BUTRANS) 10 MCG/HR PTWK Place 1 patch onto the skin once a week. 01/20/24   [provider]  gabapentin (NEURONTIN) 300 MG capsule take 1 capsule by oral route 3 times every day; start 1hs, increase q5days to tid Patient not taking: Reported on 01/25/2024 10/14/23   [provider]    Allergies: Patient has no known allergies.    Review of Systems  Updated Vital Signs BP (!) 149/101   Pulse 83   Temp 98 F (36.7 C)   Resp 18   Ht 5' 10 (1.778 m)   Wt 105 kg   SpO2 96%   BMI 33.21 kg/m   Physical Exam Vitals and nursing note reviewed.  Constitutional:      General: He is not in acute distress.    Appearance: He is well-developed.  HENT:     Head: Normocephalic and atraumatic.  Eyes:     General:        Right eye: No discharge.        Left eye: No discharge.     Conjunctiva/sclera: Conjunctivae normal.  Cardiovascular:     Rate and Rhythm: Normal rate and regular rhythm.     Heart sounds:  Normal heart sounds.  Pulmonary:     Effort: Pulmonary effort is normal.     Breath sounds: Normal breath sounds.  Abdominal:     Palpations: Abdomen is soft.     Tenderness: There is abdominal tenderness. There is no guarding or rebound. Negative signs include Murphy's sign and McBurney's sign.      Comments: No McBurney's point tenderness.  Musculoskeletal:     Cervical back: Normal range of motion and neck supple.  Skin:    General: Skin is warm and dry.  Neurological:     Mental Status: He is alert.     (all labs ordered are listed, but only abnormal results are displayed) Labs Reviewed  BASIC METABOLIC PANEL WITH GFR - Abnormal; Notable for the following components:      Result Value   Calcium 8.7 (*)    All other components within normal limits  CBC - Abnormal; Notable for the following components:   Hemoglobin 12.6 (*)    All other components within normal limits  URINALYSIS, ROUTINE W REFLEX MICROSCOPIC  HEPATIC FUNCTION PANEL  LIPASE, BLOOD    EKG: None  Radiology: No results found.   Procedures  Medications Ordered in the ED - No data to display  ED Course  Patient seen and examined. History obtained directly from patient. Work-up including labs, imaging, EKG ordered in triage, if performed, were reviewed.    Labs/EKG: Independently reviewed and interpreted.  This included: CBC with normal white blood cell count and mild anemia, normocytic; BMP unremarkable; UA without blood or signs of infection.  I have added hepatic function panel and lipase.  Imaging: Independently visualized and interpreted.  This included: CT abdomen pelvis  Medications/Fluids: None ordered, offered medication for pain, patient declines.  Most recent vital signs reviewed and are as follows: BP (!) 149/101   Pulse 83   Temp 98 F (36.7 C)   Resp 18   Ht 5' 10 (1.778 m)   Wt 105 kg   SpO2 96%   BMI 33.21 kg/m   Initial impression: Patient with some right lateral  abdominal pain with hematochezia starting yesterday, a bit worse this morning.  No fevers or other severe symptoms.  9:31 PM Discussed with Dr. Dean at shift change.   Currently pending additional lab work and CT imaging to evaluate for any causes of hematochezia and abdominal pain such as colitis or diverticulitis.  If workup negative, would likely need GI referral.                                   Medical Decision Making Amount and/or Complexity of Data Reviewed Labs: ordered. Radiology: ordered.   Lower GI bleeding suspected given passage of heme positive stool, bright red blood per rectum, hematochezia (maroon stool), clots noted in stool, red blood on toilet paper or in toilet bowl, no melanotic stools, presentation not consistent with a large upper GI bleed.  The following differential diagnoses were considered for this patient's lower GI bleed: *Hemorrhoids - can be painless, blood noted on toilet paper *Anal fissure - tearing pain with defecation, small amount of blood *Colonic polyp - usually painless *Proctitis - usually associated with passage of mucus and diarrhea *IBD - presents with crampy abdominal pain, tenesmus, fever *Infectious diarrhea - usually abrupt onset* *Diverticulosis - large volume of painless bleeding, >40yo *Mesenteric ischemia - >50yo, underlying cardiovascular disease, pain out of proportion *Colon cancer *Arteriovenous malformation  None of the following red flags identified or suspected: *Abnormal vital signs *Symptoms suggestive of malignancy such as constitutional symptoms (fever, weight loss), severe anemia, or change in frequency, caliber or consistency of stools  For this patient's complaint of abdominal pain, the following conditions were considered on the differential diagnosis: gastritis/PUD, enteritis/duodenitis, appendicitis, cholelithiasis/cholecystitis, cholangitis, pancreatitis, ruptured viscus, colitis, diverticulitis,  small/large bowel obstruction, proctitis, cystitis, pyelonephritis, ureteral colic, aortic dissection, aortic aneurysm. Atypical chest etiologies were also considered including ACS, PE, and pneumonia.  The patient appears reasonably screened and/or stabilized for discharge and I doubt any other medical condition or other emergency medical conditions requiring further screening, evaluation, or treatment in the ED at this time prior to discharge.      Final diagnoses:  Right flank pain  Hematochezia    ED Discharge Orders     None          Desiderio Chew, DEVONNA 07/13/24 2133    Haviland, Julie, MD 07/13/24 [phone]

## 2024-07-13 NOTE — ED Notes (Signed)
 Pt to CT

## 2024-07-13 NOTE — ED Triage Notes (Signed)
 Pt arrive POV c/o right side flank pain and blood on his stool since yesterday evening,

## 2024-09-22 ENCOUNTER — Encounter: Payer: Self-pay | Admitting: Physician Assistant

## 2024-10-18 ENCOUNTER — Ambulatory Visit: Admitting: Physician Assistant
